# Patient Record
Sex: Female | Born: 1970 | Race: Black or African American | Hispanic: No | Marital: Married | Smoking: Never smoker
Health system: Southern US, Community
[De-identification: ages and names within clinical notes are randomized; demographics above are authoritative.]

## PROBLEM LIST (undated history)

## (undated) DIAGNOSIS — C801 Malignant (primary) neoplasm, unspecified: Secondary | ICD-10-CM

---

## 2017-04-30 ENCOUNTER — Emergency Department (HOSPITAL_COMMUNITY)
Admission: EM | Admit: 2017-04-30 | Discharge: 2017-04-30 | Disposition: A | Discharge status: Home / Self Care | Payer: Self-pay | Attending: Emergency Medicine | Admitting: Emergency Medicine

## 2017-04-30 ENCOUNTER — Encounter (HOSPITAL_COMMUNITY): Payer: Self-pay | Admitting: Emergency Medicine

## 2017-04-30 ENCOUNTER — Emergency Department (HOSPITAL_COMMUNITY): Payer: Self-pay

## 2017-04-30 DIAGNOSIS — R05 Cough: Secondary | ICD-10-CM | POA: Insufficient documentation

## 2017-04-30 DIAGNOSIS — J00 Acute nasopharyngitis [common cold]: Secondary | ICD-10-CM | POA: Insufficient documentation

## 2017-04-30 DIAGNOSIS — Z8521 Personal history of malignant neoplasm of larynx: Secondary | ICD-10-CM | POA: Insufficient documentation

## 2017-04-30 HISTORY — DX: Malignant (primary) neoplasm, unspecified: C80.1

## 2017-04-30 MED ORDER — FLUTICASONE PROPIONATE 50 MCG/ACT NA SUSP: 2.0000 | Freq: Every day | NASAL | 0 refills | Status: AC

## 2017-04-30 MED ORDER — PSEUDOEPHEDRINE HCL ER 120 MG PO TB12: 120.0000 mg | ORAL_TABLET | Freq: Two times a day (BID) | ORAL | 0 refills | Status: AC

## 2017-04-30 NOTE — ED Provider Notes (Signed)
[facility] DEPT Provider Note   CSN: [id number] Arrival date & time: 04/30/17  1308     History   Chief Complaint Chief Complaint  Patient presents with  . Nasal Congestion  . Cough    HPI Mackenzie Oliver is a 46 y.o. female.  HPI Patient presents to the emergency room with complaints of sinus pain, congestion and cough.  Her symptoms started on Saturday.  She tried taking some Mucinex but has not helped.  She denies any fevers.  She has noticed some greenish yellowish nasal discharge and was concerned she was developing a sinus infection.  No trouble with shortness of breath.  No chest pain.  No other complaints Past Medical History:  Diagnosis Date  . 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   Past Surgical History:  Procedure Laterality Date  . CESAREAN SECTION      OB History    No data available       Home Medications    Prior to Admission medications   Medication Sig Start Date End Date Taking? Authorizing Provider  fluticasone (FLONASE) 50 MCG/ACT nasal spray Place 2 sprays into both nostrils daily. 04/30/17   Dorie Rank, MD  pseudoephedrine (SUDAFED 12 HOUR) 120 MG 12 hr tablet Take 1 tablet (120 mg total) by mouth every 12 (twelve) hours. 04/30/17   Dorie Rank, MD    Family History No family history on file.  Social History Social History   Tobacco Use  . Smoking status: Not on file  . Smokeless tobacco: Never Used  Substance Use Topics  . Alcohol use: No    Frequency: Never  . Drug use: No     Allergies   Patient has no known allergies.   Review of Systems Review of Systems  All other systems reviewed and are negative.    Physical Exam Updated Vital Signs BP 104/80 (BP Location: Right Arm)   Pulse 94   Temp 98.1 F (36.7 C) (Oral)   Resp 18   Ht 1.651 m (5\' 5" )   LMP 04/26/2017   SpO2 99%   Physical Exam  Constitutional: She appears  well-developed and well-nourished. No distress.  HENT:  Head: Normocephalic and atraumatic.  Right Ear: External ear normal.  Left Ear: External ear normal.  Mouth/Throat: No oropharyngeal exudate.  No mucopurulent nasal discharge, normal tympanic membranes  Eyes: Conjunctivae are normal. Right eye exhibits no discharge. Left eye exhibits no discharge. No scleral icterus.  Neck: Neck supple. No tracheal deviation present.  Cardiovascular: Normal rate, regular rhythm and intact distal pulses.  Pulmonary/Chest: Effort normal and breath sounds normal. No stridor. No respiratory distress. She has no wheezes. She has no rales.  Abdominal: Soft. Bowel sounds are normal. She exhibits no distension. There is no tenderness. There is no rebound and no guarding.  Musculoskeletal: She exhibits no edema or tenderness.  Neurological: She is alert. She has normal strength. No cranial nerve deficit (no facial droop, extraocular movements intact, no slurred speech) or sensory deficit. She exhibits normal muscle tone. She displays no seizure activity. Coordination normal.  Skin: Skin is warm and dry. No rash noted.  Psychiatric: She has a normal mood and affect.  Nursing note and vitals reviewed.    ED Treatments / Results  Labs (all labs ordered are listed, but only abnormal results are displayed) Labs Reviewed - No data to display  EKG  EKG Interpretation  None       Radiology Dg Chest 2 View  Result Date: 04/30/2017 CLINICAL DATA:  Cough and chest congestion for the past 3 days without fever. No associated symptoms. History of laryngeal malignancy 4 years ago. EXAM: CHEST  2 VIEW COMPARISON:  None in PACs FINDINGS: The lungs are well-expanded and clear. The heart and pulmonary vascularity are normal. The mediastinum is normal in width. There is no pleural effusion. The bony thorax exhibits no acute abnormality. IMPRESSION: There is no active cardiopulmonary disease. Electronically Signed   By:  David  Martinique M.D.   On: 04/30/2017 09:23    Procedures Procedures (including critical care time)  Medications Ordered in ED Medications - No data to display   Initial Impression / Assessment and Plan / ED Course  I have reviewed the triage vital signs and the nursing notes.  Pertinent labs & imaging results that were available during my care of the patient were reviewed by me and considered in my medical decision making (see chart for details).    I suspect the patient has a viral upper respiratory infection with possibly a component of sinusitis.  I will try her on some steroid nasal spray for her sinus congestion.  I recommend Sudafed as well.  Discussed follow-up with her primary care doctor if not improving in the next week.  Final Clinical Impressions(s) / ED Diagnoses   Final diagnoses:  Acute nasopharyngitis    ED Discharge Orders        Ordered    fluticasone (FLONASE) 50 MCG/ACT nasal spray  Daily     04/30/17 0959    pseudoephedrine (SUDAFED 12 HOUR) 120 MG 12 hr tablet  Every 12 hours     04/30/17 0959       Dorie Rank, MD 04/30/17 1008

## 2017-04-30 NOTE — ED Notes (Signed)
Bed: WA05 Expected date:  Expected time:  Means of arrival:  Comments: 

## 2017-04-30 NOTE — ED Triage Notes (Signed)
Patient reports that she been having sinus pain, congestion with productive cough since Saturday. Been taking Mucinex but symptoms still persisting.

## 2017-04-30 NOTE — Discharge Instructions (Signed)
Take the sudafed and Flonase as prescribed.  Follow up with your primary care doctor if not better in a week.

## 2017-07-30 ENCOUNTER — Other Ambulatory Visit (HOSPITAL_COMMUNITY)
Admission: RE | Admit: 2017-07-30 | Discharge: 2017-07-30 | Disposition: A | Discharge status: Home / Self Care | Payer: BLUE CROSS/BLUE SHIELD | Source: Ambulatory Visit | Attending: Nurse Practitioner | Admitting: Nurse Practitioner

## 2017-07-30 ENCOUNTER — Other Ambulatory Visit: Payer: Self-pay | Admitting: Nurse Practitioner

## 2017-07-30 DIAGNOSIS — N926 Irregular menstruation, unspecified: Secondary | ICD-10-CM | POA: Diagnosis not present

## 2017-07-30 DIAGNOSIS — Z01419 Encounter for gynecological examination (general) (routine) without abnormal findings: Secondary | ICD-10-CM | POA: Insufficient documentation

## 2017-07-30 DIAGNOSIS — Z113 Encounter for screening for infections with a predominantly sexual mode of transmission: Secondary | ICD-10-CM | POA: Diagnosis not present

## 2017-08-01 LAB — CYTOLOGY - PAP
DIAGNOSIS: NEGATIVE
HPV: NOT DETECTED

## 2017-08-29 DIAGNOSIS — N898 Other specified noninflammatory disorders of vagina: Secondary | ICD-10-CM | POA: Diagnosis not present

## 2017-08-29 DIAGNOSIS — N926 Irregular menstruation, unspecified: Secondary | ICD-10-CM | POA: Diagnosis not present

## 2017-09-17 ENCOUNTER — Other Ambulatory Visit: Payer: Self-pay | Admitting: Nurse Practitioner

## 2017-09-17 DIAGNOSIS — N926 Irregular menstruation, unspecified: Secondary | ICD-10-CM | POA: Diagnosis not present

## 2017-09-17 DIAGNOSIS — N859 Noninflammatory disorder of uterus, unspecified: Secondary | ICD-10-CM | POA: Diagnosis not present

## 2018-07-29 DIAGNOSIS — F99 Mental disorder, not otherwise specified: Secondary | ICD-10-CM | POA: Diagnosis not present

## 2019-02-24 ENCOUNTER — Other Ambulatory Visit: Payer: Self-pay

## 2019-02-24 ENCOUNTER — Ambulatory Visit: Payer: Self-pay

## 2019-02-24 ENCOUNTER — Other Ambulatory Visit: Payer: Self-pay | Admitting: Family Medicine

## 2019-02-24 DIAGNOSIS — M25511 Pain in right shoulder: Secondary | ICD-10-CM

## 2019-02-24 DIAGNOSIS — M25531 Pain in right wrist: Secondary | ICD-10-CM

## 2019-03-24 DIAGNOSIS — M25531 Pain in right wrist: Secondary | ICD-10-CM | POA: Diagnosis not present

## 2019-03-24 DIAGNOSIS — M25511 Pain in right shoulder: Secondary | ICD-10-CM | POA: Diagnosis not present

## 2019-03-25 DIAGNOSIS — M25531 Pain in right wrist: Secondary | ICD-10-CM | POA: Diagnosis not present

## 2019-03-30 DIAGNOSIS — M25531 Pain in right wrist: Secondary | ICD-10-CM | POA: Diagnosis not present

## 2019-09-15 DIAGNOSIS — Z1151 Encounter for screening for human papillomavirus (HPV): Secondary | ICD-10-CM | POA: Diagnosis not present

## 2019-09-15 DIAGNOSIS — Z1231 Encounter for screening mammogram for malignant neoplasm of breast: Secondary | ICD-10-CM | POA: Diagnosis not present

## 2019-09-15 DIAGNOSIS — Z01419 Encounter for gynecological examination (general) (routine) without abnormal findings: Secondary | ICD-10-CM | POA: Diagnosis not present

## 2019-09-15 DIAGNOSIS — Z130 Encounter for screening for diseases of the blood and blood-forming organs and certain disorders involving the immune mechanism: Secondary | ICD-10-CM | POA: Diagnosis not present

## 2019-09-15 DIAGNOSIS — N898 Other specified noninflammatory disorders of vagina: Secondary | ICD-10-CM | POA: Diagnosis not present

## 2019-09-15 DIAGNOSIS — Z113 Encounter for screening for infections with a predominantly sexual mode of transmission: Secondary | ICD-10-CM | POA: Diagnosis not present

## 2019-09-15 DIAGNOSIS — Z124 Encounter for screening for malignant neoplasm of cervix: Secondary | ICD-10-CM | POA: Diagnosis not present

## 2019-09-15 DIAGNOSIS — Z6829 Body mass index (BMI) 29.0-29.9, adult: Secondary | ICD-10-CM | POA: Diagnosis not present

## 2019-10-02 DIAGNOSIS — F411 Generalized anxiety disorder: Secondary | ICD-10-CM | POA: Diagnosis not present

## 2019-10-06 DIAGNOSIS — F411 Generalized anxiety disorder: Secondary | ICD-10-CM | POA: Diagnosis not present

## 2019-10-06 DIAGNOSIS — R030 Elevated blood-pressure reading, without diagnosis of hypertension: Secondary | ICD-10-CM | POA: Diagnosis not present

## 2019-10-06 DIAGNOSIS — R519 Headache, unspecified: Secondary | ICD-10-CM | POA: Diagnosis not present

## 2019-10-10 ENCOUNTER — Emergency Department (HOSPITAL_COMMUNITY)
Admission: EM | Admit: 2019-10-10 | Discharge: 2019-10-10 | Disposition: A | Discharge status: Home / Self Care | Payer: BC Managed Care – PPO | Attending: Emergency Medicine | Admitting: Emergency Medicine

## 2019-10-10 ENCOUNTER — Other Ambulatory Visit: Payer: Self-pay

## 2019-10-10 ENCOUNTER — Emergency Department (HOSPITAL_COMMUNITY): Payer: BC Managed Care – PPO

## 2019-10-10 DIAGNOSIS — I10 Essential (primary) hypertension: Secondary | ICD-10-CM | POA: Diagnosis not present

## 2019-10-10 DIAGNOSIS — R519 Headache, unspecified: Secondary | ICD-10-CM | POA: Insufficient documentation

## 2019-10-10 DIAGNOSIS — Z79899 Other long term (current) drug therapy: Secondary | ICD-10-CM | POA: Diagnosis not present

## 2019-10-10 LAB — CBC WITH DIFFERENTIAL/PLATELET
Abs Immature Granulocytes: 0.01 10*3/uL (ref 0.00–0.07)
Basophils Absolute: 0 10*3/uL (ref 0.0–0.1)
Basophils Relative: 1 %
Eosinophils Absolute: 0.1 10*3/uL (ref 0.0–0.5)
Eosinophils Relative: 2 %
HCT: 40.4 % (ref 36.0–46.0)
Hemoglobin: 12.9 g/dL (ref 12.0–15.0)
Immature Granulocytes: 0 %
Lymphocytes Relative: 52 %
Lymphs Abs: 2 10*3/uL (ref 0.7–4.0)
MCH: 27.9 pg (ref 26.0–34.0)
MCHC: 31.9 g/dL (ref 30.0–36.0)
MCV: 87.3 fL (ref 80.0–100.0)
Monocytes Absolute: 0.3 10*3/uL (ref 0.1–1.0)
Monocytes Relative: 8 %
Neutro Abs: 1.4 10*3/uL — ABNORMAL LOW (ref 1.7–7.7)
Neutrophils Relative %: 37 %
Platelets: 231 10*3/uL (ref 150–400)
RBC: 4.63 MIL/uL (ref 3.87–5.11)
RDW: 13.1 % (ref 11.5–15.5)
WBC: 3.8 10*3/uL — ABNORMAL LOW (ref 4.0–10.5)
nRBC: 0 % (ref 0.0–0.2)

## 2019-10-10 LAB — I-STAT BETA HCG BLOOD, ED (MC, WL, AP ONLY): I-stat hCG, quantitative: <5 m[IU]/mL (ref <5)

## 2019-10-10 LAB — BASIC METABOLIC PANEL
Anion gap: 8 (ref 5–15)
BUN: 9 mg/dL (ref 6–20)
CO2: 25 mmol/L (ref 22–32)
Calcium: 9.3 mg/dL (ref 8.9–10.3)
Chloride: 105 mmol/L (ref 98–111)
Creatinine, Ser: 0.78 mg/dL (ref 0.44–1.00)
GFR calc Af Amer: >60 mL/min (ref >60)
GFR calc non Af Amer: >60 mL/min (ref >60)
Glucose, Bld: 85 mg/dL (ref 70–99)
Potassium: 4.1 mmol/L (ref 3.5–5.1)
Sodium: 138 mmol/L (ref 135–145)

## 2019-10-10 MED ORDER — METOCLOPRAMIDE HCL 5 MG/ML IJ SOLN
10.0000 mg | Freq: Once | INTRAMUSCULAR | Status: AC
  Administered 2019-10-10: 10 mg via INTRAVENOUS
  Filled 2019-10-10: qty 2

## 2019-10-10 MED ORDER — METOCLOPRAMIDE HCL 10 MG PO TABS
10.0000 mg | ORAL_TABLET | Freq: Four times a day (QID) | ORAL | 0 refills | Status: DC | PRN
Start: 2019-10-10 — End: 2019-10-29

## 2019-10-10 MED ORDER — DIPHENHYDRAMINE HCL 50 MG/ML IJ SOLN
25.0000 mg | Freq: Once | INTRAMUSCULAR | Status: AC
  Administered 2019-10-10: 25 mg via INTRAVENOUS
  Filled 2019-10-10: qty 1

## 2019-10-10 NOTE — Discharge Instructions (Signed)
Take reglan for nausea or headaches   See your doctor. Consider following up with neurology if you have worse headaches   Return to ER if you have vomiting, worse headaches, weakness, numbness

## 2019-10-10 NOTE — ED Notes (Signed)
An After Visit Summary was printed and given to the patient. Discharge instructions given and no further questions at this time.  

## 2019-10-10 NOTE — ED Provider Notes (Signed)
[name]   CSN: [id number] Arrival date & time: 10/10/19  1535     History Chief Complaint  Patient presents with  . Hypertension  . Headache    Mackenzie Oliver is a 49 y.o. female who presented with headache.  She states that she went to a wedding in [place] last week.  She states that she did eat some red meat and she usually is vegan.  She states that she came back on Monday and she started having some headaches.  She states that it is diffuse and associated with some dizziness.  Patient went to the fire station on Tuesday and her blood pressure is 140/90.  She has persistent headache and dizziness.  She denies any trouble speaking or weakness or numbness.  Denies any fevers or chills or neck pain.  The history is provided by the patient.       No past medical history on file.  There are no problems to display for this patient.    OB History   No obstetric history on file.     No family history on file.  Social History   Tobacco Use  . Smoking status: Not on file  Substance Use Topics  . Alcohol use: Not on file  . Drug use: Not on file    Home Medications Prior to Admission medications   Medication Sig Start Date End Date Taking? Authorizing Provider  Apple Cider Vinegar 188 MG CAPS Take 2 capsules by mouth daily.   Yes [provider]  Garlic 10 MG CAPS Take 2 capsules by mouth daily.   Yes [provider]    Allergies    Patient has no known allergies.  Review of Systems   Review of Systems  Neurological: Positive for headaches.  All other systems reviewed and are negative.   Physical Exam Updated Vital Signs BP (!) 150/86 (BP Location: Left Arm)   Pulse 76   Temp 98.2 F (36.8 C) (Oral)   Resp 17   Ht 5\' 4"  (1.626 m)   Wt 79.8 kg   LMP 08/11/2019   SpO2 100%   BMI 30.21 kg/m   Physical Exam Vitals and nursing note reviewed.  Constitutional:      Appearance: She is  well-developed.  HENT:     Head: Normocephalic.     Mouth/Throat:     Mouth: Mucous membranes are moist.  Eyes:     Extraocular Movements: Extraocular movements intact.     Pupils: Pupils are equal, round, and reactive to light.  Cardiovascular:     Rate and Rhythm: Normal rate and regular rhythm.     Heart sounds: Normal heart sounds.  Pulmonary:     Effort: Pulmonary effort is normal.     Breath sounds: Normal breath sounds.  Abdominal:     General: Bowel sounds are normal.     Palpations: Abdomen is soft.  Musculoskeletal:        General: Normal range of motion.     Cervical back: Normal range of motion and neck supple.  Skin:    General: Skin is warm.  Neurological:     Mental Status: She is alert and oriented to person, place, and time. Mental status is at baseline.     Cranial Nerves: No cranial nerve deficit or facial asymmetry.  Psychiatric:        Mood and Affect: Mood normal.        Behavior: Behavior normal.  ED Results / Procedures / Treatments   Labs (all labs ordered are listed, but only abnormal results are displayed) Labs Reviewed  CBC WITH DIFFERENTIAL/PLATELET - Abnormal; Notable for the following components:      Result Value   WBC 3.8 (*)    Neutro Abs 1.4 (*)    All other components within normal limits  BASIC METABOLIC PANEL  I-STAT BETA HCG BLOOD, ED ([facility], [facility], [facility] ONLY)    EKG None  Radiology No results found.  Procedures Procedures (including critical care time)  Medications Ordered in ED Medications  metoCLOPramide (REGLAN) injection 10 mg (10 mg Intravenous Given 10/10/19 1618)  diphenhydrAMINE (BENADRYL) injection 25 mg (25 mg Intravenous Given 10/10/19 1618)    ED Course  I have reviewed the triage vital signs and the nursing notes.  Pertinent labs & imaging results that were available during my care of the patient were reviewed by me and considered in my medical decision making (see chart for details).    MDM  Rules/Calculators/A&P                      [name] is a 49 y.o. female who presented with headaches.  Patient is noted to be hypertensive around 150.  I think likely symptomatic hypertension versus migraines.  Patient has nonfocal neuro exam right now.  Low suspicion for subarachnoid so if CT head is normal, will not need LP.  Given that she had new onset headaches, will get CT head.  We will also get CBC and BMP.  Will give pain cocktail reassess.  6:09 PM BP down to 119/70.  Headache improved.  CT head and labs unremarkable.  Likely migraines, stable for discharge.  Final Clinical Impression(s) / ED Diagnoses Final diagnoses:  None    Rx / DC Orders ED Discharge Orders    None       Drenda Freeze, MD 10/10/19 1810

## 2019-10-10 NOTE — ED Triage Notes (Signed)
Per patient, she was visiting [place] and as she driving back her head began to hurt and feel tight. Patient reports also feeling dizzy. Patient states she had her bp checked and they told her it was elevated. Patient states head does not hurt now but feels tight.

## 2019-10-21 ENCOUNTER — Emergency Department (HOSPITAL_COMMUNITY): Payer: BC Managed Care – PPO

## 2019-10-21 ENCOUNTER — Emergency Department (HOSPITAL_COMMUNITY)
Admission: EM | Admit: 2019-10-21 | Discharge: 2019-10-21 | Disposition: A | Discharge status: Home / Self Care | Payer: BC Managed Care – PPO | Attending: Emergency Medicine | Admitting: Emergency Medicine

## 2019-10-21 ENCOUNTER — Other Ambulatory Visit: Payer: Self-pay

## 2019-10-21 ENCOUNTER — Encounter: Payer: Self-pay | Admitting: Neurology

## 2019-10-21 ENCOUNTER — Encounter (HOSPITAL_COMMUNITY): Payer: Self-pay

## 2019-10-21 DIAGNOSIS — Y9389 Activity, other specified: Secondary | ICD-10-CM | POA: Insufficient documentation

## 2019-10-21 DIAGNOSIS — R42 Dizziness and giddiness: Secondary | ICD-10-CM | POA: Diagnosis not present

## 2019-10-21 DIAGNOSIS — Y92012 Bathroom of single-family (private) house as the place of occurrence of the external cause: Secondary | ICD-10-CM | POA: Insufficient documentation

## 2019-10-21 DIAGNOSIS — S0990XA Unspecified injury of head, initial encounter: Secondary | ICD-10-CM | POA: Insufficient documentation

## 2019-10-21 DIAGNOSIS — Y998 Other external cause status: Secondary | ICD-10-CM | POA: Diagnosis not present

## 2019-10-21 DIAGNOSIS — W19XXXA Unspecified fall, initial encounter: Secondary | ICD-10-CM | POA: Insufficient documentation

## 2019-10-21 DIAGNOSIS — R55 Syncope and collapse: Secondary | ICD-10-CM

## 2019-10-21 DIAGNOSIS — R197 Diarrhea, unspecified: Secondary | ICD-10-CM | POA: Insufficient documentation

## 2019-10-21 DIAGNOSIS — Z859 Personal history of malignant neoplasm, unspecified: Secondary | ICD-10-CM | POA: Insufficient documentation

## 2019-10-21 DIAGNOSIS — F411 Generalized anxiety disorder: Secondary | ICD-10-CM | POA: Diagnosis not present

## 2019-10-21 DIAGNOSIS — R519 Headache, unspecified: Secondary | ICD-10-CM | POA: Diagnosis not present

## 2019-10-21 HISTORY — DX: Malignant (primary) neoplasm, unspecified: C80.1

## 2019-10-21 LAB — CBC
HCT: 39.7 % (ref 36.0–46.0)
Hemoglobin: 12.9 g/dL (ref 12.0–15.0)
MCH: 27.7 pg (ref 26.0–34.0)
MCHC: 32.5 g/dL (ref 30.0–36.0)
MCV: 85.4 fL (ref 80.0–100.0)
Platelets: 249 10*3/uL (ref 150–400)
RBC: 4.65 MIL/uL (ref 3.87–5.11)
RDW: 13.1 % (ref 11.5–15.5)
WBC: 4 10*3/uL (ref 4.0–10.5)
nRBC: 0 % (ref 0.0–0.2)

## 2019-10-21 LAB — BASIC METABOLIC PANEL
Anion gap: 12 (ref 5–15)
BUN: 9 mg/dL (ref 6–20)
CO2: 24 mmol/L (ref 22–32)
Calcium: 9 mg/dL (ref 8.9–10.3)
Chloride: 102 mmol/L (ref 98–111)
Creatinine, Ser: 0.77 mg/dL (ref 0.44–1.00)
GFR calc Af Amer: >60 mL/min (ref >60)
GFR calc non Af Amer: >60 mL/min (ref >60)
Glucose, Bld: 106 mg/dL — ABNORMAL HIGH (ref 70–99)
Potassium: 3.7 mmol/L (ref 3.5–5.1)
Sodium: 138 mmol/L (ref 135–145)

## 2019-10-21 LAB — URINALYSIS, ROUTINE W REFLEX MICROSCOPIC
Bilirubin Urine: NEGATIVE
Glucose, UA: NEGATIVE mg/dL
Ketones, ur: NEGATIVE mg/dL
Nitrite: NEGATIVE
Protein, ur: NEGATIVE mg/dL
Specific Gravity, Urine: 1.008 (ref 1.005–1.030)
pH: 6 (ref 5.0–8.0)

## 2019-10-21 LAB — CBG MONITORING, ED: Glucose-Capillary: 100 mg/dL — ABNORMAL HIGH (ref 70–99)

## 2019-10-21 LAB — I-STAT BETA HCG BLOOD, ED (MC, WL, AP ONLY): I-stat hCG, quantitative: <5 m[IU]/mL (ref <5)

## 2019-10-21 MED ORDER — ONDANSETRON 4 MG PO TBDP
4.0000 mg | ORAL_TABLET | Freq: Three times a day (TID) | ORAL | 0 refills | Status: DC | PRN
Start: 2019-10-21 — End: 2019-10-29

## 2019-10-21 MED ORDER — SODIUM CHLORIDE 0.9% FLUSH: 3.0000 mL | Freq: Once | INTRAVENOUS | Status: DC

## 2019-10-21 NOTE — ED Notes (Signed)
Discharge paperwork and prescription reviewed with pt.  Pt provided printed CT results, per her request.

## 2019-10-21 NOTE — ED Provider Notes (Signed)
[facility] DEPT Provider Note   CSN: [id number] Arrival date & time: 10/21/19  [id number]     History Chief Complaint  Patient presents with   Loss of Consciousness    Mackenzie Oliver is a 49 y.o. female.  HPI Patient still had a syncopal episode last night.  States she went to bed feeling fine but this morning she woke up feeling as if she had to go to the bathroom.  States she went to the bathroom and had some diarrhea.  States she felt very hot and sweaty.  Went to go turn on the air conditioning and then passed out.  Unknown time on the ground.  States she has a headache in the back of her head now.  States he was carpeted floor but there was a box was out of place but does not know if she had hit it.  No chest pain.  No trouble breathing.  Abdomen is feeling now but she states she feels little light in her head.  No fevers.  Cancer free but did have previous cancer history.    Past Medical History:  Diagnosis Date   Cancer ([place])     There are no problems to display for this patient.    OB History   No obstetric history on file.     No family history on file.  Social History   Tobacco Use   Smoking status: Not on file  Substance Use Topics   Alcohol use: Not on file   Drug use: Not on file    Home Medications Prior to Admission medications   Medication Sig Start Date End Date Taking? Authorizing Provider  APPLE CIDER VINEGAR PO Take 10 mLs by mouth daily.   Yes [provider]  Garlic 10 MG CAPS Take 2 capsules by mouth daily.   Yes [provider]  metoCLOPramide (REGLAN) 10 MG tablet Take 1 tablet (10 mg total) by mouth every 6 (six) hours as needed for nausea (nausea/headache). Patient not taking: Reported on 10/21/2019 10/10/19   Drenda Freeze, MD  ondansetron (ZOFRAN-ODT) 4 MG disintegrating tablet Take 1 tablet (4 mg total) by mouth every 8 (eight) hours as needed for nausea or vomiting. 10/21/19   Davonna Belling, MD    Allergies    Patient has no known allergies.  Review of Systems   Review of Systems  Constitutional: Positive for diaphoresis. Negative for appetite change.  HENT: Negative for congestion.   Respiratory: Negative for shortness of breath.   Cardiovascular: Negative for chest pain.  Gastrointestinal: Positive for abdominal distention and diarrhea.  Genitourinary: Negative for flank pain.  Musculoskeletal: Negative for back pain.  Skin: Negative for rash.  Neurological: Positive for syncope and headaches.  Psychiatric/Behavioral: Negative for confusion.    Physical Exam Updated Vital Signs BP 121/82    Pulse 64    Temp 98.4 F (36.9 C) (Oral)    Resp 19    Ht 5\' 5"  (1.651 m)    Wt 79.8 kg    SpO2 100%    BMI 29.29 kg/m   Physical Exam Vitals and nursing note reviewed.  HENT:     Head:     Comments: Some tenderness lower occipital area of skull    Mouth/Throat:     Mouth: Mucous membranes are moist.  Eyes:     Extraocular Movements: Extraocular movements intact.  Cardiovascular:     Rate and Rhythm: Normal rate and regular rhythm.  Pulmonary:  Effort: Pulmonary effort is normal.     Breath sounds: Normal breath sounds.  Abdominal:     Tenderness: There is no abdominal tenderness.  Musculoskeletal:        General: No tenderness.     Cervical back: Neck supple.  Skin:    General: Skin is warm.     Capillary Refill: Capillary refill takes less than 2 seconds.  Neurological:     Mental Status: She is alert and oriented to person, place, and time.  Psychiatric:        Mood and Affect: Mood normal.     ED Results / Procedures / Treatments   Labs (all labs ordered are listed, but only abnormal results are displayed) Labs Reviewed  BASIC METABOLIC PANEL - Abnormal; Notable for the following components:      Result Value   Glucose, Bld 106 (*)    All other components within normal limits  URINALYSIS, ROUTINE W REFLEX MICROSCOPIC - Abnormal; Notable  for the following components:   Hgb urine dipstick SMALL (*)    Leukocytes,Ua SMALL (*)    Bacteria, UA RARE (*)    All other components within normal limits  CBG MONITORING, ED - Abnormal; Notable for the following components:   Glucose-Capillary 100 (*)    All other components within normal limits  CBC  I-STAT BETA HCG BLOOD, ED ([facility], [facility], [facility] ONLY)    EKG EKG Interpretation  Date/Time:  Wednesday October 21 2019 06:53:12 EDT Ventricular Rate:  75 PR Interval:    QRS Duration: 78 QT Interval:  375 QTC Calculation: 419 R Axis:   54 Text Interpretation: Sinus rhythm Anteroseptal infarct, old Borderline T abnormalities, inferior leads 12 Lead; [place] Confirmed by Davonna Belling [phone]) on 10/21/2019 7:59:30 AM   Radiology CT Head Wo Contrast  Result Date: 10/21/2019 CLINICAL DATA:  Syncope episode striking head. Headache and dizziness. EXAM: CT HEAD WITHOUT CONTRAST TECHNIQUE: Contiguous axial images were obtained from the base of the skull through the vertex without intravenous contrast. COMPARISON:  10/10/2019 FINDINGS: Brain: The brainstem, cerebellum, cerebral peduncles, thalami, basal ganglia, basilar cisterns, and ventricular system appear within normal limits. No intracranial hemorrhage, mass lesion, or acute CVA. Vascular: Unremarkable Skull: Unremarkable Sinuses/Orbits: Chronic right frontal sinusitis. Other: No supplemental non-categorized findings. IMPRESSION: 1. No acute intracranial findings. 2. Chronic right frontal sinusitis. Electronically Signed   By: Van Clines M.D.   On: 10/21/2019 09:15    Procedures Procedures (including critical care time)  Medications Ordered in ED Medications - No data to display  ED Course  I have reviewed the triage vital signs and the nursing notes.  Pertinent labs & imaging results that were available during my care of the patient were reviewed by me and considered in my medical decision making (see chart for details).     MDM Rules/Calculators/A&P                      Patient with syncopal episode.  Fell and hit head.  Head CT done reassuring.  Likely concussion.  Feels better.  Abdominal pain improved.  Lab work reassuring except for mild hyperglycemia.  Patient informed of this.  EKG reassuring.  Not orthostatic.  I think most likely had a vagal event due to the GI symptoms and led to the syncope.  Discharge home with outpatient follow-up as needed. Final Clinical Impression(s) / ED Diagnoses Final diagnoses:  Vasovagal syncope  Minor head injury, initial encounter  Diarrhea, unspecified type  Rx / DC Orders ED Discharge Orders         Ordered    ondansetron (ZOFRAN-ODT) 4 MG disintegrating tablet  Every 8 hours PRN     10/21/19 0933           Davonna Belling, MD 10/21/19 [phone]

## 2019-10-21 NOTE — ED Triage Notes (Signed)
Patient arrived stating this morning she woke up from sleeping around 430am with severe generalized abdominal pain and feeling hot. States she got up to turn the air on and passed out onto carpet flooring for an unknown amount of time. Patient reports abdominal pain has resolved but still feels lightheaded. Declines any changes in vision or headache at this time.

## 2019-10-26 DIAGNOSIS — F411 Generalized anxiety disorder: Secondary | ICD-10-CM | POA: Diagnosis not present

## 2019-10-29 ENCOUNTER — Encounter: Payer: Self-pay | Admitting: Neurology

## 2019-10-29 ENCOUNTER — Ambulatory Visit (INDEPENDENT_AMBULATORY_CARE_PROVIDER_SITE_OTHER): Payer: BC Managed Care – PPO | Admitting: Neurology

## 2019-10-29 VITALS — BP 117/72 | HR 68 | Ht 64.0 in | Wt 177.0 lb

## 2019-10-29 DIAGNOSIS — R55 Syncope and collapse: Secondary | ICD-10-CM | POA: Diagnosis not present

## 2019-10-29 NOTE — Progress Notes (Signed)
ED follow-up   Provider:  Larey Seat, MD  Primary Care Physician:  Mackenzie Oliver, No Pcp Per No address on file     Referring Provider: ED  Dr. Ivan Anchors         Chief Complaint according to Mackenzie Oliver   Mackenzie Oliver presents with:    . New Mackenzie Oliver (Initial Visit)           HISTORY OF PRESENT ILLNESS:  Mackenzie Oliver is a 49 year old 28 or [place] American female Mackenzie Oliver seen here upon a referral on 10/29/2019 from ED.  Chief concern according to Mackenzie Oliver : " I fainted"   I have the pleasure of seeing Mackenzie Oliver today, a right handed African American female with a syncope.  [name] states that her husband had left for work the morning of 21 October 2019 when she felt really very hot had a bad feeling and went to the bathroom.  After she had a bowel movement she felt still very hot and diaphoretic and went to the thermostat to drink to temperature. There was a little lightheadedness that she has reported but no vertigo.  No true fever, no tunnel vision no other aura.  However she had felt somewhat ill and sickly and having stomach pains - she had diarrhea. She felt all this  for several minutes before she passed out.  She passed out in the hallway of her home and is not sure how long she may have been on the floor.  She came back by herself was able to call her husband and asked him to drive her to the emergency room.  She woke up on a carpeted floor with a neck strain and reported that after she was evaluated in the ED when returning home she felt that both legs were numbish.  However the feeling passed. She had a scratch on the right wrist and a container on the floor was moved. She is sore at neck and wrist.   She was seen on 10-10-2019 also in ED with elevated BP - and dizziness, lightheadedness. She reported vertigo before - and she felt a very tight band around around the skalp.   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    Mackenzie Oliver has no  history of heart disease, seizures or neurologic disorders. She had vertigo in 2014.    ER labs did not include a stool sample. CT negative , no head injury. No acute intracranial findings.  Chronic right frontal sinusitis. EKG was automatically interpreted - T abnormality.       Review of Systems: Out of a complete 14 system review, the Mackenzie Oliver complains of only the following symptoms, and all other reviewed systems are negative.:   Stomach pains - for a day or two. Epigastric and BM were diarrhea.     Social History   Socioeconomic History  . Marital status: Married    Spouse name: Not on file  . Number of children: Not on file  . Years of education: Not on file  . Highest education level: Not on file  Occupational History  . Not on file  Tobacco Use  . Smoking status: Never Smoker  . Smokeless tobacco: Never Used  Substance and Sexual Activity  . Alcohol use: Never  . Drug use: Never  . Sexual activity: Not on file  Other Topics Concern  . Not on file  Social History Narrative  . Not on file  Social Determinants of Health   Financial Resource Strain:   . Difficulty of Paying Living Expenses:   Food Insecurity:   . Worried About Charity fundraiser in the Last Year:   . Arboriculturist in the Last Year:   Transportation Needs:   . Film/video editor (Medical):   Marland Kitchen Lack of Transportation (Non-Medical):   Physical Activity:   . Days of Exercise per Week:   . Minutes of Exercise per Session:   Stress:   . Feeling of Stress :   Social Connections:   . Frequency of Communication with Friends and Family:   . Frequency of Social Gatherings with Friends and Family:   . Attends Religious Services:   . Active Member of Clubs or Organizations:   . Attends Archivist Meetings:   Marland Kitchen Marital Status:     Family History  Problem Relation Age of Onset  . High blood pressure Mother   . Cancer Father     Past Medical History:  Diagnosis Date  . Cancer  [facility])      Current Outpatient Medications on File Prior to Visit  Medication Sig Dispense Refill  . APPLE CIDER VINEGAR PO Take 10 mLs by mouth daily.    . Garlic 10 MG CAPS Take 2 capsules by mouth daily.     No current facility-administered medications on file prior to visit.    Physical exam:  The Mackenzie Oliver's orthostatic blood pressure and heart rate were obtained in supine position blood pressure was 120/72 mmHg with a regular heart rate of 65 bpm. Seated position 117/75 mmHg and a regular heart rate of 68 bpm. Standing 119/80 mmHg and a heart rate of 72.   Today's Vitals   10/29/19 0827  BP: 117/72  Pulse: 68  Weight: 177 lb (80.3 kg)  Height: 5\' 4"  (1.626 m)   Body mass index is 30.38 kg/m.   Wt Readings from Last 3 Encounters:  10/29/19 177 lb (80.3 kg)  10/21/19 176 lb (79.8 kg)  10/10/19 176 lb (79.8 kg)     Ht Readings from Last 3 Encounters:  10/29/19 5\' 4"  (1.626 m)  10/21/19 5\' 5"  (1.651 m)  10/10/19 5\' 4"  (1.626 m)      General: The Mackenzie Oliver is awake, alert and appears not in acute distress.  The Mackenzie Oliver is well groomed. Head: Normocephalic, atraumatic. Neck is supple. Mallampati:2 ,  neck circumference: 15 inches . Nasal airflow  patent.   Cardiovascular:  Regular rate and cardiac rhythm by pulse,  without distended neck veins. Respiratory: Lungs are clear to auscultation.  Skin:  Without evidence of ankle edema, or rash. Trunk: The Mackenzie Oliver's posture is erect.   Neurologic exam : The Mackenzie Oliver is awake and alert, oriented to place and time.   Memory subjective described as intact.  Attention span & concentration ability appears normal.  Speech is fluent,  without  dysarthria, dysphonia or aphasia.  Mood and affect are appropriate.   Cranial nerves: no loss of smell or taste reported  Pupils are equal and briskly reactive to light. Funduscopic exam  without pallor or retinal scars, edema. Extraocular movements in vertical and horizontal planes were  intact and without nystagmus. No Diplopia. Visual fields by finger perimetry are intact. Hearing was intact to soft voice and finger rubbing.    Facial sensation intact to fine touch.  Palpation of the sinus trigger points did not elicit pain or discomfort. Facial motor strength is symmetric and tongue and uvula  move midline.  Borderline ptosis both eyes. Neck ROM : rotation, tilt and flexion extension were normal for age and shoulder shrug was symmetrical.    Motor exam:  Symmetric bulk, tone and ROM.   Normal tone without cog wheeling, symmetric grip strength .   Sensory:  Fine touch and vibration were tested  and  normal.  Proprioception tested in the upper extremities was normal.   Coordination: Rapid alternating movements in the fingers/hands were of normal speed.  The Finger-to-nose maneuver was intact without evidence of ataxia, dysmetria or tremor. The Mackenzie Oliver does not experience changes in handwriting or fine motor skills otherwise.   Gait and station: Mackenzie Oliver could rise unassisted from a seated position, walked without assistive device.  Stance is of normal width/ base and the Mackenzie Oliver turned with 3 steps.  Toe and heel walk were deferred.  Deep tendon reflexes: in the  upper and lower extremities are symmetrically brisk and intact.  Babinski response was deferred.       After spending a total time of 45  minutes face to face and additional time for physical and neurologic examination, review of laboratory studies,  personal review of imaging studies, reports and results of other testing and review of referral information / records as far as provided in visit, I have established the following assessments:  1) Ms. Mason Burleigh is a 49 year old right-handed female Mackenzie Oliver who moved from [place] to [place] and has not yet established a primary care physician. 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 In late May she experienced high blood pressures, now and early June she had a bout of abdominal pain and discomfort followed by diarrhea, a spell of diaphoresis and passing out apparently while in the process of changing with some stop settings.  She may have bumped her head given that she had some headaches afterwards and a sore spot but CTs have not shown any intracranial abnormality or injury. Her diagnosis was a vasovagal syncope, and in spite of no evidence of incontinence or bruising or tongue bite she was referred to neurology.    My Plan is to proceed with:  1) The Mackenzie Oliver needs a primary care provider, these are not primary neurologic problems and need to be coordinated in work up by other than the ED>  2) this is a classic syncope in a Mackenzie Oliver with GI symptoms at the time.  3) No further neuro work up.    PCP at Home:  Terrace Arabia, [place], [place] Phone [phone] area.  [phone].   Electronically signed by: Larey Seat, MD 10/29/2019 8:42 AM  [facility] Board certified by The [employer] of Sleep Medicine and Diplomate of the [employer] of Sleep Medicine. Board certified In Neurology through the [place], Fellow of the [employer] of Neurology. Medical Director of [employer].

## 2019-10-29 NOTE — Patient Instructions (Signed)
     Syncope Syncope is when you pass out (faint) for a short time. It is caused by a sudden decrease in blood flow to the brain. Signs that you may be about to pass out include:  Feeling dizzy or light-headed.  Feeling sick to your stomach (nauseous).  Seeing all white or all black.  Having cold, clammy skin. If you pass out, get help right away. Call your local emergency services (911 in the [place] Do not drive yourself to the hospital. Follow these instructions at home: Watch for any changes in your symptoms. Take these actions to stay safe and help with your symptoms: Lifestyle  Do not drive, use machinery, or play sports until your doctor says it is okay.  Do not drink alcohol.  Do not use any products that contain nicotine or tobacco, such as cigarettes and e-cigarettes. If you need help quitting, ask your doctor.  Drink enough fluid to keep your pee (urine) pale yellow. General instructions  Take over-the-counter and prescription medicines only as told by your doctor.  If you are taking blood pressure or heart medicine, sit up and stand up slowly. Spend a few minutes getting ready to sit and then stand. This can help you feel less dizzy.  Have someone stay with you until you feel stable.  If you start to feel like you might pass out, lie down right away and raise (elevate) your feet above the level of your heart. Breathe deeply and steadily. Wait until all of the symptoms are gone.  Keep all follow-up visits as told by your doctor. This is important. Get help right away if:  You have a very bad headache.  You pass out once or more than once.  You have pain in your chest, belly, or back.  You have a very fast or uneven heartbeat (palpitations).  It hurts to breathe.  You are bleeding from your mouth or your bottom (rectum).  You have black or tarry poop (stool).  You have jerky movements that you cannot control (seizure).  You are confused.  You have  trouble walking.  You are very weak.  You have vision problems. These symptoms may be an emergency. Do not wait to see if the symptoms will go away. Get medical help right away. Call your local emergency services (911 in the [place] Do not drive yourself to the hospital. Summary  Syncope is when you pass out (faint) for a short time. It is caused by a sudden decrease in blood flow to the brain.  Signs that you may be about to faint include feeling dizzy, light-headed, or sick to your stomach, seeing all white or all black, or having cold, clammy skin.  If you start to feel like you might pass out, lie down right away and raise (elevate) your feet above the level of your heart. Breathe deeply and steadily. Wait until all of the symptoms are gone. This information is not intended to replace advice given to you by your health care provider. Make sure you discuss any questions you have with your health care provider. Document Revised: 06/19/2017 Document Reviewed: 06/19/2017 Elsevier Patient Education  2020 Elsevier Inc.  

## 2019-11-04 DIAGNOSIS — F411 Generalized anxiety disorder: Secondary | ICD-10-CM | POA: Diagnosis not present

## 2019-11-09 DIAGNOSIS — F411 Generalized anxiety disorder: Secondary | ICD-10-CM | POA: Diagnosis not present

## 2019-11-16 DIAGNOSIS — F411 Generalized anxiety disorder: Secondary | ICD-10-CM | POA: Diagnosis not present

## 2019-11-23 DIAGNOSIS — F411 Generalized anxiety disorder: Secondary | ICD-10-CM | POA: Diagnosis not present

## 2019-11-30 DIAGNOSIS — F411 Generalized anxiety disorder: Secondary | ICD-10-CM | POA: Diagnosis not present

## 2019-12-07 DIAGNOSIS — F411 Generalized anxiety disorder: Secondary | ICD-10-CM | POA: Diagnosis not present

## 2019-12-15 DIAGNOSIS — F411 Generalized anxiety disorder: Secondary | ICD-10-CM | POA: Diagnosis not present

## 2019-12-23 DIAGNOSIS — F411 Generalized anxiety disorder: Secondary | ICD-10-CM | POA: Diagnosis not present

## 2020-01-04 DIAGNOSIS — F411 Generalized anxiety disorder: Secondary | ICD-10-CM | POA: Diagnosis not present

## 2020-01-11 ENCOUNTER — Ambulatory Visit: Payer: BC Managed Care – PPO | Admitting: Neurology

## 2020-02-02 DIAGNOSIS — F411 Generalized anxiety disorder: Secondary | ICD-10-CM | POA: Diagnosis not present

## 2020-02-05 DIAGNOSIS — N7689 Other specified inflammation of vagina and vulva: Secondary | ICD-10-CM | POA: Diagnosis not present

## 2020-02-05 DIAGNOSIS — Z7251 High risk heterosexual behavior: Secondary | ICD-10-CM | POA: Diagnosis not present

## 2020-02-09 DIAGNOSIS — F411 Generalized anxiety disorder: Secondary | ICD-10-CM | POA: Diagnosis not present

## 2020-02-15 DIAGNOSIS — F411 Generalized anxiety disorder: Secondary | ICD-10-CM | POA: Diagnosis not present

## 2020-02-22 DIAGNOSIS — F411 Generalized anxiety disorder: Secondary | ICD-10-CM | POA: Diagnosis not present

## 2020-03-11 DIAGNOSIS — Z113 Encounter for screening for infections with a predominantly sexual mode of transmission: Secondary | ICD-10-CM | POA: Diagnosis not present

## 2020-03-11 DIAGNOSIS — R102 Pelvic and perineal pain: Secondary | ICD-10-CM | POA: Diagnosis not present

## 2020-03-11 DIAGNOSIS — N952 Postmenopausal atrophic vaginitis: Secondary | ICD-10-CM | POA: Diagnosis not present

## 2020-03-11 DIAGNOSIS — Z3202 Encounter for pregnancy test, result negative: Secondary | ICD-10-CM | POA: Diagnosis not present

## 2020-03-22 DIAGNOSIS — N898 Other specified noninflammatory disorders of vagina: Secondary | ICD-10-CM | POA: Diagnosis not present

## 2020-03-22 DIAGNOSIS — R102 Pelvic and perineal pain: Secondary | ICD-10-CM | POA: Diagnosis not present

## 2020-03-22 DIAGNOSIS — N926 Irregular menstruation, unspecified: Secondary | ICD-10-CM | POA: Diagnosis not present

## 2020-04-05 DIAGNOSIS — F411 Generalized anxiety disorder: Secondary | ICD-10-CM | POA: Diagnosis not present

## 2020-04-19 DIAGNOSIS — F411 Generalized anxiety disorder: Secondary | ICD-10-CM | POA: Diagnosis not present

## 2020-04-27 DIAGNOSIS — F411 Generalized anxiety disorder: Secondary | ICD-10-CM | POA: Diagnosis not present

## 2020-12-05 ENCOUNTER — Emergency Department (HOSPITAL_COMMUNITY)
Admission: EM | Admit: 2020-12-05 | Discharge: 2020-12-05 | Disposition: A | Discharge status: Home / Self Care | Payer: 59 | Attending: Emergency Medicine | Admitting: Emergency Medicine

## 2020-12-05 ENCOUNTER — Emergency Department (HOSPITAL_COMMUNITY): Payer: 59

## 2020-12-05 ENCOUNTER — Encounter (HOSPITAL_COMMUNITY): Payer: Self-pay

## 2020-12-05 ENCOUNTER — Other Ambulatory Visit: Payer: Self-pay

## 2020-12-05 DIAGNOSIS — N9489 Other specified conditions associated with female genital organs and menstrual cycle: Secondary | ICD-10-CM | POA: Diagnosis not present

## 2020-12-05 DIAGNOSIS — R0789 Other chest pain: Secondary | ICD-10-CM | POA: Diagnosis not present

## 2020-12-05 DIAGNOSIS — R079 Chest pain, unspecified: Secondary | ICD-10-CM

## 2020-12-05 DIAGNOSIS — M549 Dorsalgia, unspecified: Secondary | ICD-10-CM | POA: Diagnosis not present

## 2020-12-05 DIAGNOSIS — Z8521 Personal history of malignant neoplasm of larynx: Secondary | ICD-10-CM | POA: Diagnosis not present

## 2020-12-05 LAB — CBC
HCT: 38.2 % (ref 36.0–46.0)
Hemoglobin: 12.7 g/dL (ref 12.0–15.0)
MCH: 28.3 pg (ref 26.0–34.0)
MCHC: 33.2 g/dL (ref 30.0–36.0)
MCV: 85.1 fL (ref 80.0–100.0)
Platelets: 245 10*3/uL (ref 150–400)
RBC: 4.49 MIL/uL (ref 3.87–5.11)
RDW: 13.3 % (ref 11.5–15.5)
WBC: 3.4 10*3/uL — ABNORMAL LOW (ref 4.0–10.5)
nRBC: 0 % (ref 0.0–0.2)

## 2020-12-05 LAB — BASIC METABOLIC PANEL
Anion gap: 7 (ref 5–15)
BUN: 8 mg/dL (ref 6–20)
CO2: 25 mmol/L (ref 22–32)
Calcium: 9.8 mg/dL (ref 8.9–10.3)
Chloride: 107 mmol/L (ref 98–111)
Creatinine, Ser: 0.63 mg/dL (ref 0.44–1.00)
GFR, Estimated: >60 mL/min (ref >60)
Glucose, Bld: 123 mg/dL — ABNORMAL HIGH (ref 70–99)
Potassium: 3.7 mmol/L (ref 3.5–5.1)
Sodium: 139 mmol/L (ref 135–145)

## 2020-12-05 LAB — I-STAT BETA HCG BLOOD, ED (MC, WL, AP ONLY): I-stat hCG, quantitative: <5 m[IU]/mL (ref <5)

## 2020-12-05 LAB — TROPONIN I (HIGH SENSITIVITY)
Troponin I (High Sensitivity): <2 ng/L (ref <18)
Troponin I (High Sensitivity): <2 ng/L (ref <18)

## 2020-12-05 MED ORDER — HYDROXYZINE HCL 25 MG PO TABS: 25.0000 mg | ORAL_TABLET | Freq: Four times a day (QID) | ORAL | 0 refills | Status: AC | PRN

## 2020-12-05 NOTE — ED Triage Notes (Signed)
Patient reports intermittent chest pressure that radiates into the mid x 1 week. Patient denies any SOB, N/V.

## 2020-12-05 NOTE — ED Provider Notes (Signed)
[facility] DEPT Provider Note   CSN: [id number] Arrival date & time: 12/05/20  1607     History Chief Complaint  Patient presents with   Chest Pain    Mackenzie Oliver is a 50 y.o. female.  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  Patient is presenting to the ER today with complaints of episodic chest pain since Friday that has resolved now.  She states that this seems to come and go.  She has had 2 episodes while in the emergency room for the past 5 hours.  Denies any lightheadedness or dizziness.  Denies any shortness of breath nausea vomiting.  She states the pain is achy and central nonradiating nonexertional nonpleuritic.  She states that she has had some separate back pain but does not correlate with when her chest pain comes on.  She denies any numbness or weakness in the arm she states that she does occasionally have some left arm numbness but this is a ongoing issue also does not seem to temporally correlate to her chest pain.  She denies any fevers chills nausea vomiting diarrhea lightheadedness or dizziness.  She states that she takes no medications chronically.   She denies any new exercise or lifting.  She does state that she is under symptoms about of stress currently because of home life reasons.  She states she feels safe at home but is not interested in disclosing more than this.    Past Medical History:  Diagnosis Date   Cancer [facility])     Patient Active Problem List   Diagnosis Date Noted   Syncope, vasovagal 10/29/2019    Past Surgical History:  Procedure Laterality Date   CESAREAN SECTION       OB History   No obstetric history on file.     Family History  Problem Relation Age of Onset   High blood pressure Mother    Cancer Father     Social History   Tobacco Use   Smoking status: Never   Smokeless tobacco:  Never  Vaping Use   Vaping Use: Never used  Substance Use Topics   Alcohol use: Never   Drug use: Never    Home Medications Prior to Admission medications   Medication Sig Start Date End Date Taking? Authorizing Provider  hydrOXYzine (ATARAX/VISTARIL) 25 MG tablet Take 1 tablet (25 mg total) by mouth every 6 (six) hours as needed. 12/05/20  Yes Jakylah Bassinger S, PA  APPLE CIDER VINEGAR PO Take 10 mLs by mouth daily.    [provider]  Garlic 10 MG CAPS Take 2 capsules by mouth daily.    [provider]    Allergies    Patient has no known allergies.  Review of Systems   Review of Systems  Constitutional:  Negative for chills and fever.  HENT:  Negative for congestion.   Eyes:  Negative for pain.  Respiratory:  Negative for shortness of breath.   Cardiovascular:  Positive for chest pain. Negative for leg swelling.  Gastrointestinal:  Negative for abdominal pain, diarrhea, nausea and vomiting.  Genitourinary:  Negative for dysuria.  Musculoskeletal:  Negative for myalgias.  Skin:  Negative for rash.  Neurological:  Negative for dizziness and headaches.   Physical Exam Updated Vital Signs BP 130/81   Pulse 65   Temp 98.2 F (36.8 C) (Oral)   Resp 17   Ht  5\' 5"  (1.651 m)   Wt 77.1 kg   LMP  (Exact Date)   SpO2 100%   BMI 28.29 kg/m   Physical Exam Vitals and nursing note reviewed.  Constitutional:      General: She is not in acute distress.    Comments: Pleasant well-appearing 50 year old.  In no acute distress.  Sitting comfortably in bed.  Able answer questions appropriately follow commands. No increased work of breathing. Speaking in full sentences.  HENT:     Head: Normocephalic and atraumatic.     Nose: Nose normal.  Eyes:     General: No scleral icterus. Cardiovascular:     Rate and Rhythm: Normal rate and regular rhythm.     Pulses: Normal pulses.     Heart sounds: Normal heart sounds.  Pulmonary:     Effort: Pulmonary effort is  normal. No respiratory distress.     Breath sounds: No wheezing.  Abdominal:     Palpations: Abdomen is soft.     Tenderness: There is no abdominal tenderness. There is no guarding or rebound.  Musculoskeletal:     Cervical back: Normal range of motion.     Right lower leg: No edema.     Left lower leg: No edema.     Comments: No lower extremity edema.  No calf tenderness.  Skin:    General: Skin is warm and dry.     Capillary Refill: Capillary refill takes less than 2 seconds.  Neurological:     Mental Status: She is alert. Mental status is at baseline.  Psychiatric:        Mood and Affect: Mood normal.        Behavior: Behavior normal.    ED Results / Procedures / Treatments   Labs (all labs ordered are listed, but only abnormal results are displayed) Labs Reviewed  BASIC METABOLIC PANEL - Abnormal; Notable for the following components:      Result Value   Glucose, Bld 123 (*)    All other components within normal limits  CBC - Abnormal; Notable for the following components:   WBC 3.4 (*)    All other components within normal limits  I-STAT BETA HCG BLOOD, ED ([facility], [facility], [facility] ONLY)  TROPONIN I (HIGH SENSITIVITY)  TROPONIN I (HIGH SENSITIVITY)    EKG None  Radiology DG Chest 2 View  Result Date: 12/05/2020 CLINICAL DATA:  Chest pain. EXAM: CHEST - 2 VIEW COMPARISON:  None. FINDINGS: The cardiomediastinal contours are normal. The lungs are clear. Pulmonary vasculature is normal. No consolidation, pleural effusion, or pneumothorax. No acute osseous abnormalities are seen. IMPRESSION: Negative radiographs of the chest. Electronically Signed   By: Keith Rake M.D.   On: 12/05/2020 20:48    Procedures Procedures   Medications Ordered in ED Medications - No data to display  ED Course  I have reviewed the triage vital signs and the nursing notes.  Pertinent labs & imaging results that were available during my care of the patient were reviewed by me and considered in my  medical decision making (see chart for details).  Clinical Course as of 12/05/20 2302  Mon Dec 05, 2020  2019 The emergent causes of chest pain include: Acute coronary syndrome, tamponade, pericarditis/myocarditis, aortic dissection, pulmonary embolism, tension pneumothorax, pneumonia, and esophageal rupture  I do not believe the patient has an emergent cause of chest pain, other urgent/non-acute considerations include, but are not limited to: chronic angina, aortic stenosis, cardiomyopathy, mitral valve prolapse, pulmonary hypertension, aortic  insufficiency, right ventricular hypertrophy, pleuritis, bronchitis, pneumothorax, tumor, gastroesophageal reflux disease (GERD), esophageal spasm, Mallory-Weiss syndrome, peptic ulcer disease, pancreatitis, functional gastrointestinal pain, cervical or thoracic disk disease or arthritis, shoulder arthritis, costochondritis, subacromial bursitis, anxiety or panic attack, herpes zoster, breast disorders, chest wall tumors, thoracic outlet syndrome, mediastinitis.    [WF]    Clinical Course User Index [WF] Tedd Sias, [place]   MDM Rules/Calculators/A&P                          Patient has intermittent chest pain no chest pain currently.  Vital signs within normal limits physical exam quite reassuring.  Low risk for pulmonary embolism has a history of cancer however it is remote was vocal cord tumor which is removed.  No other risk factors for PE.  She is well-appearing not tachycardic tachypneic or hypoxic.  Low suspicion for ACS given both the intermittent and unprovoked nature but also the atypical nature of it.  Overall she is quite reassuring exam.  Chest x-ray without abnormality.  EKG nonischemic.  No ST-T wave abnormalities of note.  Normal axis.  Some T wave inversions in V3, V4, V5.  Otherwise unremarkable EKG  Troponin x2 within normal limits.  BMP unremarkable.  CBC with mild leukopenia.  I-STAT hCG negative.  She is given very strict  return precautions.  She feels that her anxiety may be causing some of her symptoms therefore we will treat with hydroxyzine and discharged home with strict return precautions.  She will follow-up with PCP.  Final Clinical Impression(s) / ED Diagnoses Final diagnoses:  Chest pain, unspecified type  Atypical chest pain    Rx / DC Orders ED Discharge Orders          Ordered    hydrOXYzine (ATARAX/VISTARIL) 25 MG tablet  Every 6 hours PRN        12/05/20 2055             Tedd Sias, [place] 12/05/20 2304    Tegeler, Gwenyth Allegra, MD 12/05/20 [phone]

## 2020-12-05 NOTE — Discharge Instructions (Addendum)
Please follow-up with your primary care provider.  Please drink plenty of water you may take Tylenol 1000 mg every 6 hours for pain.  You may also apply a small amount of Voltaren gel to your chest--these treatments may improve your symptoms if this is a muscular injury however that seems relatively less likely.  I am also prescribing medication called hydroxyzine/Vistaril.  Please take this as prescribed.  Can help with anxiety related symptoms.  Ultimately however it is a second line medication and you should talk to your primary care provider about other medications for anxiety.   Please return to the ER for any new or concerning symptoms.

## 2020-12-05 NOTE — ED Provider Notes (Signed)
Emergency Medicine Provider Triage Evaluation Note  Mackenzie Oliver , a 50 y.o. female  was evaluated in triage.  Pt complains of episodes of central chest pain on Friday that self resolved now presenting to the emergency department today with concern for central chest pressure that radiates to her back.  No associated shortness of breath or palpitations.  No history of the same.  Denies any history of heartburn patient is a survivor of vocal cord cancer and is requesting to avoid any unnecessary radiation, including chest x-rays.  Review of Systems  Positive: Chest pain/pressure that radiates to the back Negative: Shortness of breath, palpitations, nausea, vomiting, diarrhea, fevers, chills  Physical Exam  BP 120/79   Pulse 77   Temp 98.2 F (36.8 C) (Oral)   Resp 15   Ht 5\' 5"  (1.651 m)   Wt 77.1 kg   LMP  (Exact Date)   SpO2 100%   BMI 28.29 kg/m  Gen:   Awake, no distress   Resp:  Normal effort  MSK:   Moves extremities without difficulty  Other:  RRR no M/R/G.  No epigastric tenderness to palpation.  Medical Decision Making  Medically screening exam initiated at 5:48 PM.  Appropriate orders placed.  Jalee Saine was informed that the remainder of the evaluation will be completed by another provider, this initial triage assessment does not replace that evaluation, and the importance of remaining in the ED until their evaluation is complete.  This chart was dictated using voice recognition software, Dragon. Despite the best efforts of this provider to proofread and correct errors, errors may still occur which can change documentation meaning.   Patient has declined chest x-ray at this time.   Aura Dials 12/05/20 1749    Quintella Reichert, MD 12/05/20 [phone]

## 2021-10-05 IMAGING — CT CT HEAD W/O CM
3 series · 16 of 47 positions shown, 19 images · non-contrast
Comparison: None.

CLINICAL DATA: Headache, dizziness, hypertension

EXAM:
CT HEAD WITHOUT CONTRAST
TECHNIQUE: Contiguous axial images were obtained from the base of the skull
through the vertex without intravenous contrast.

[Series 2: head wo · axial · 0.47mm/px · z∈[+1576,+1701]mm · 10 of 31 slices shown, 13 images]
[im 3/31  brain]
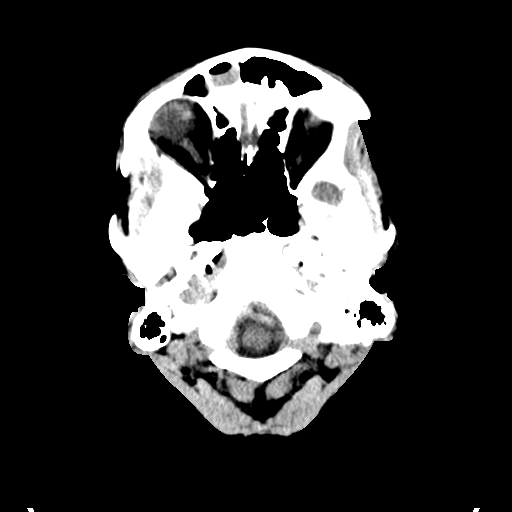
[im 3/31  bone]
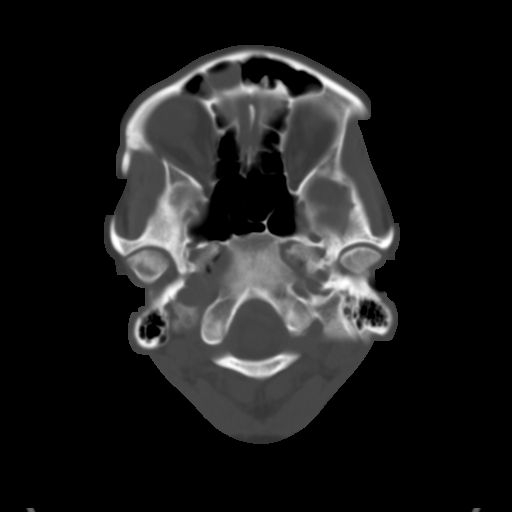
[im 6/31  brain]
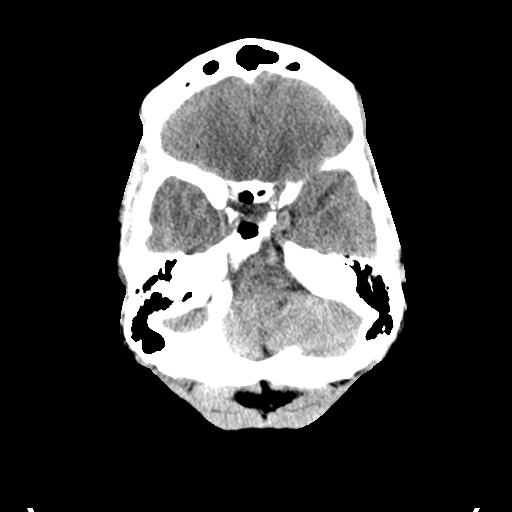
[im 9/31  brain]
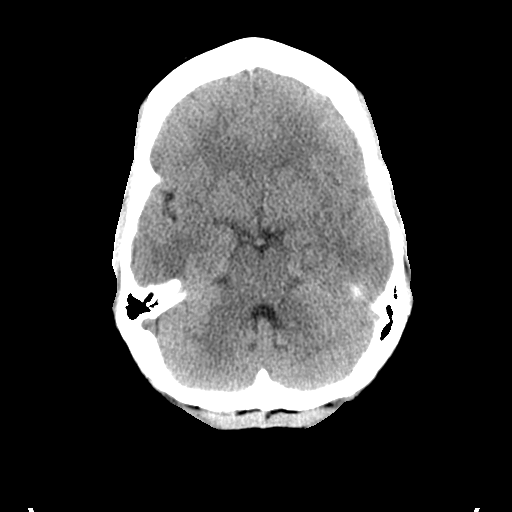
[im 11/31  brain]
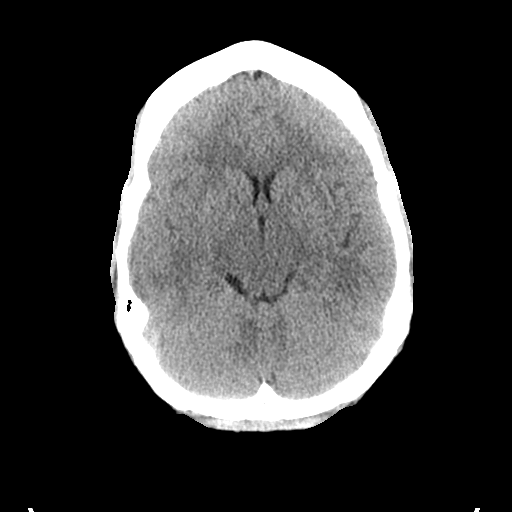
[im 14/31  brain]
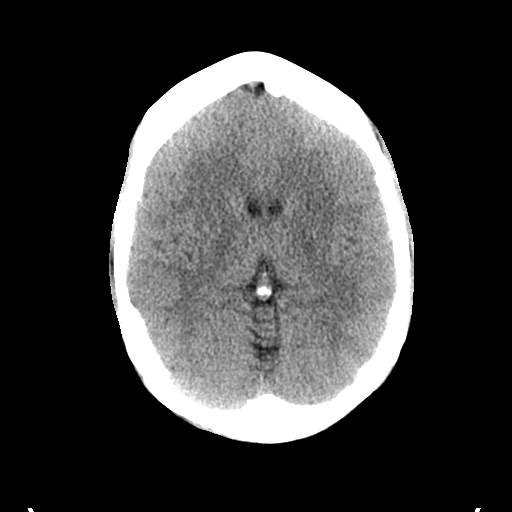
[im 14/31  bone]
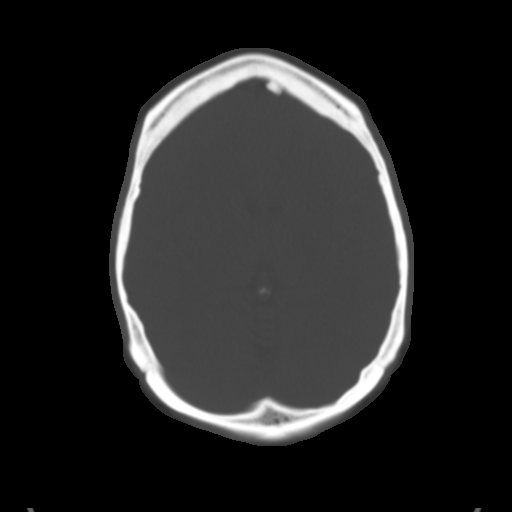
[im 17/31  brain]
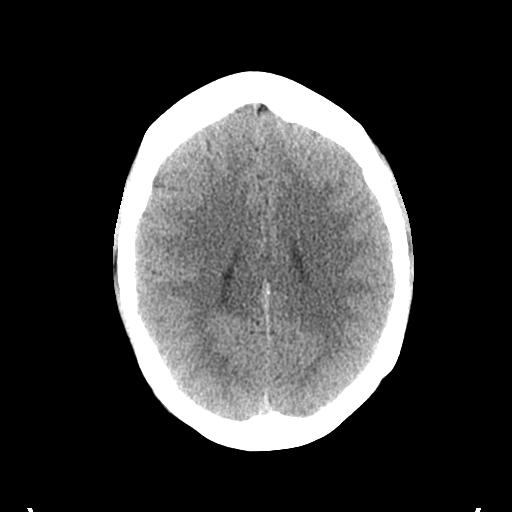
[im 20/31  brain]
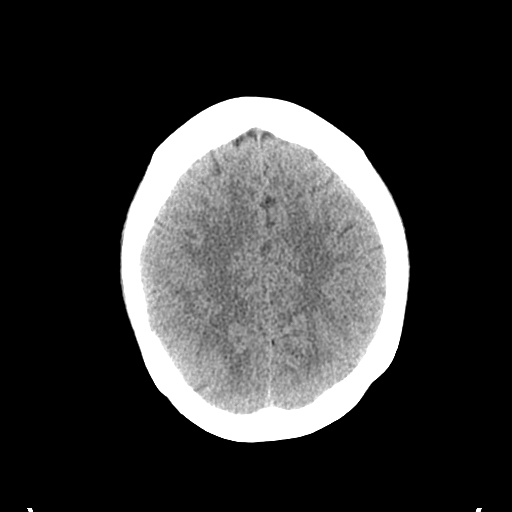
[im 23/31  brain]
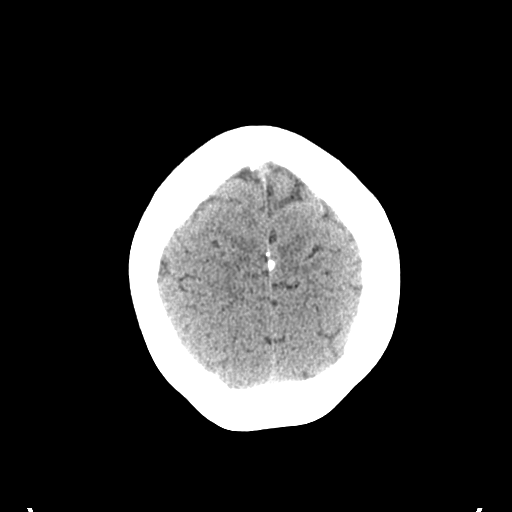
[im 25/31  brain]
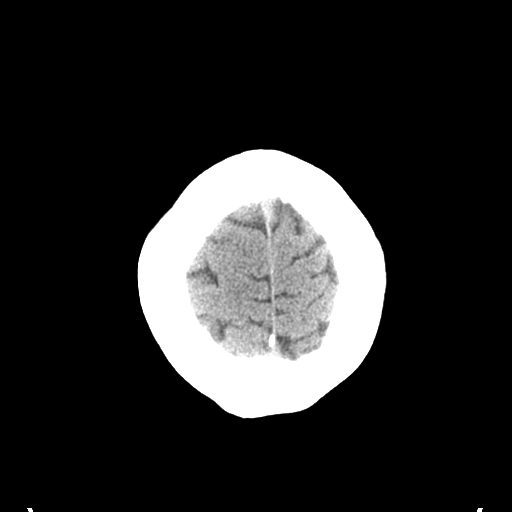
[im 25/31  bone]
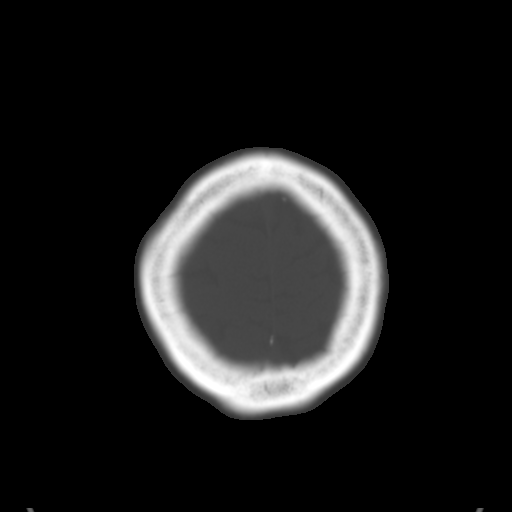
[im 28/31  brain]
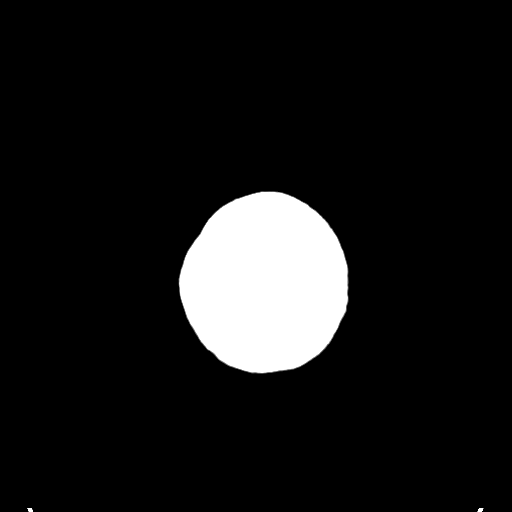

[Series 5: coronal soft tissue · coronal · 0.30mm/px · 3 of 69 slices shown]
[im 23/69  brain]
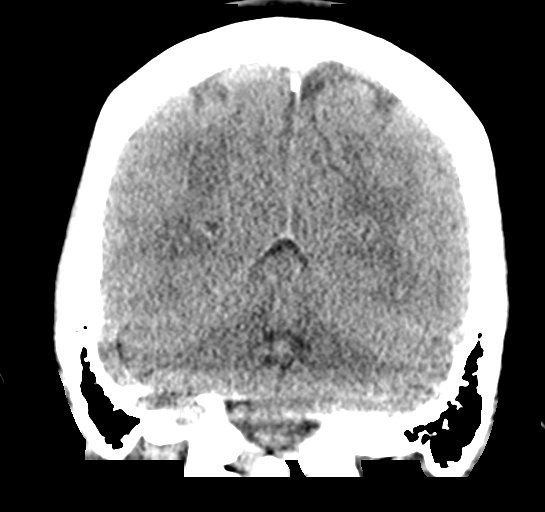
[im 31/69  brain]
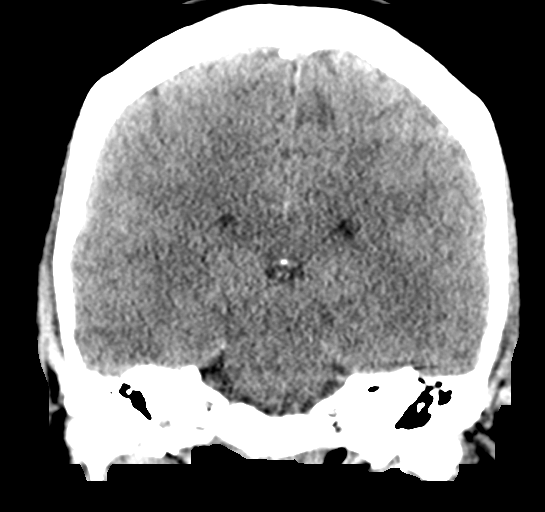
[im 38/69  brain]
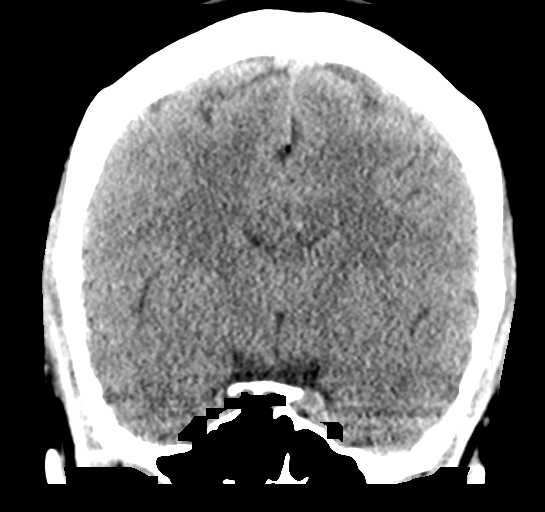

[Series 6: sagittal soft tissue · sagittal · 0.30mm/px · 3 of 56 slices shown]
[im 19/56  brain]
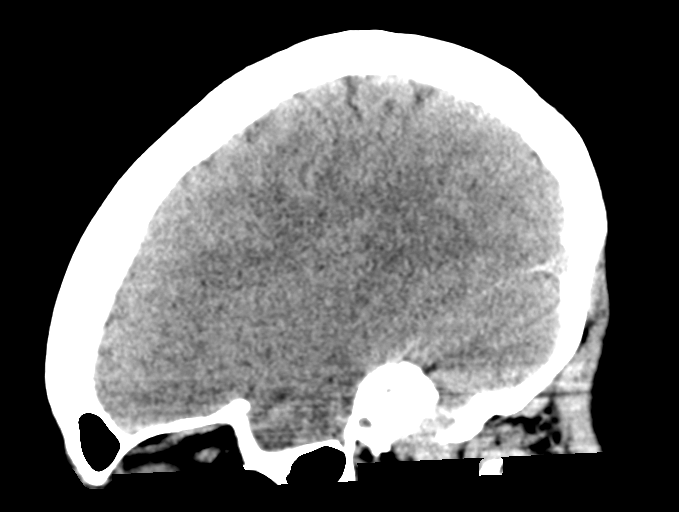
[im 28/56  brain]
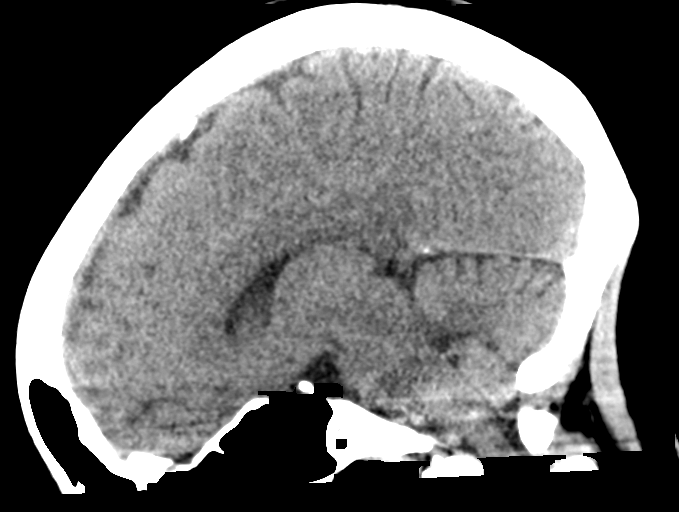
[im 37/56  brain]
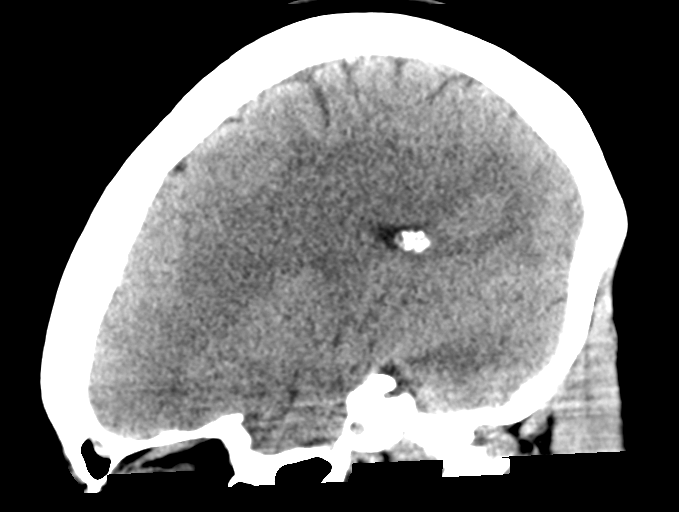

[16 of 47 positions shown; findings below may reference images not displayed]

FINDINGS: Brain: No acute infarct or hemorrhage. Lateral ventricles and
midline structures are unremarkable. No acute extra-axial fluid
collections. No mass effect.

Vascular: No hyperdense vessel or unexpected calcification.

Skull: Normal. Negative for fracture or focal lesion.

Sinuses/Orbits: There is opacification of the right frontal sinus.
Remaining sinuses are clear.

Other: None.
IMPRESSION: 1. Right frontal sinus disease.
2. No acute intracranial process.

## 2022-03-27 ENCOUNTER — Emergency Department (HOSPITAL_COMMUNITY): Payer: 59

## 2022-03-27 ENCOUNTER — Emergency Department (HOSPITAL_COMMUNITY)
Admission: EM | Admit: 2022-03-27 | Discharge: 2022-03-28 | Disposition: A | Discharge status: Home / Self Care | Payer: 59 | Attending: Emergency Medicine | Admitting: Emergency Medicine

## 2022-03-27 ENCOUNTER — Other Ambulatory Visit: Payer: Self-pay

## 2022-03-27 DIAGNOSIS — I2510 Atherosclerotic heart disease of native coronary artery without angina pectoris: Secondary | ICD-10-CM | POA: Diagnosis not present

## 2022-03-27 DIAGNOSIS — J069 Acute upper respiratory infection, unspecified: Secondary | ICD-10-CM | POA: Diagnosis not present

## 2022-03-27 DIAGNOSIS — Z1152 Encounter for screening for COVID-19: Secondary | ICD-10-CM | POA: Diagnosis not present

## 2022-03-27 DIAGNOSIS — Z8521 Personal history of malignant neoplasm of larynx: Secondary | ICD-10-CM | POA: Insufficient documentation

## 2022-03-27 DIAGNOSIS — J029 Acute pharyngitis, unspecified: Secondary | ICD-10-CM | POA: Diagnosis present

## 2022-03-27 LAB — RESP PANEL BY RT-PCR (FLU A&B, COVID) ARPGX2
Influenza A by PCR: NEGATIVE
Influenza B by PCR: NEGATIVE
SARS Coronavirus 2 by RT PCR: NEGATIVE

## 2022-03-27 MED ORDER — HYDROCODONE BIT-HOMATROP MBR 5-1.5 MG/5ML PO SOLN: 5.0000 mL | Freq: Four times a day (QID) | ORAL | 0 refills | Status: AC | PRN

## 2022-03-27 NOTE — ED Triage Notes (Signed)
Body aches, cough since Tuesday getting worse  Body aches.  Fever and chills.

## 2022-03-27 NOTE — ED Provider Triage Note (Signed)
Emergency Medicine Provider Triage Evaluation Note  Mackenzie Oliver , a 51 y.o. female  was evaluated in triage.  Pt complains of body aches, nasal congestion, cough, runny nose times a week.  Recently had a cruise for her birthday, she is been feeling bad since then.  She has been trying elderberry syrup, vitamin C, over-the-counter analgesics with some relief.  Denies any specific chest pain but her chest does hurt when she coughs..  Review of Systems  Per HPI  Physical Exam  BP 116/89 (BP Location: Left Arm)   Pulse 88   Temp 97.9 F (36.6 C) (Oral)   Resp 16   LMP 08/11/2019   SpO2 100%  Gen:   Awake, no distress   Resp:  Normal effort  MSK:   Moves extremities without difficulty  Other:  Lungs clear  Medical Decision Making  Medically screening exam initiated at 6:40 PM.  Appropriate orders placed.  Vern Claude was informed that the remainder of the evaluation will be completed by another provider, this initial triage assessment does not replace that evaluation, and the importance of remaining in the ED until their evaluation is complete.     Sherrill Raring, PA-C 03/27/22 1842

## 2022-03-27 NOTE — ED Provider Notes (Signed)
[facility] Provider Note   CSN: [id number] Arrival date & time: 03/27/22  1757     History  Chief Complaint  Patient presents with   Cough    [name] is a 51 y.o. female.  51 year old female presents today for evaluation of about 1 week duration of URI symptoms including body aches, cough, pharyngitis, sinus congestion, left conjunctivitis.  Conjunctivitis started this morning.  No significant drainage from the eye.  Denies any vision change.  Also has chest pain only with coughing spells.  No resting chest pain.  Denies pleuritic chest pain.  She also was seen for atypical chest pain by her PCP and referred to cardiologist where she underwent cardiac testing including stress test and was told her work-up was reassuring.  She has history of vocal cord cancer, otherwise no significant past medical history.  She states after onset of her symptoms she did take over-the-counter supplements including elderberry and vitamin C.  Denies shortness of breath.  Primary concern for today's visit was to ensure she did not have pneumonia.  The history is provided by the patient. No language interpreter was used.       Home Medications Prior to Admission medications   Medication Sig Start Date End Date Taking? Authorizing Provider  HYDROcodone bit-homatropine (HYCODAN) 5-1.5 MG/5ML syrup Take 5 mLs by mouth every 6 (six) hours as needed for cough. 03/27/22  Yes Marlene Beidler, PA-C  APPLE CIDER VINEGAR PO Take 10 mLs by mouth daily.    [provider]  fluticasone (FLONASE) 50 MCG/ACT nasal spray Place 2 sprays into both nostrils daily. 04/30/17   Dorie Rank, MD  Garlic 10 MG CAPS Take 2 capsules by mouth daily.    [provider]  hydrOXYzine (ATARAX/VISTARIL) 25 MG tablet Take 1 tablet (25 mg total) by mouth every 6 (six) hours as needed. 12/05/20   Tedd Sias, PA  pseudoephedrine (SUDAFED 12 HOUR) 120 MG 12 hr tablet Take 1 tablet  (120 mg total) by mouth every 12 (twelve) hours. 04/30/17   Dorie Rank, MD      Allergies    Patient has no known allergies.    Review of Systems   Review of Systems  Constitutional:  Negative for chills and fever.  HENT:  Positive for congestion, postnasal drip and sore throat. Negative for drooling, trouble swallowing and voice change.   Respiratory:  Positive for cough. Negative for shortness of breath.   Cardiovascular:  Positive for chest pain (only with coughing spells). Negative for palpitations and leg swelling.  Neurological:  Negative for light-headedness.  All other systems reviewed and are negative.   Physical Exam Updated Vital Signs BP 133/86 (BP Location: Left Arm)   Pulse 73   Temp 98.2 F (36.8 C) (Oral)   Resp 16   LMP 08/11/2019   SpO2 100%  Physical Exam Vitals and nursing note reviewed.  Constitutional:      General: She is not in acute distress.    Appearance: Normal appearance. She is not ill-appearing.  HENT:     Head: Normocephalic and atraumatic.     Right Ear: Tympanic membrane, ear canal and external ear normal. There is no impacted cerumen.     Left Ear: Tympanic membrane, ear canal and external ear normal. There is no impacted cerumen.     Nose: Nose normal.     Mouth/Throat:     Mouth: Mucous membranes are moist.     Pharynx: No oropharyngeal  exudate or posterior oropharyngeal erythema.  Eyes:     General: No scleral icterus.    Extraocular Movements: Extraocular movements intact.     Conjunctiva/sclera: Conjunctivae normal.  Cardiovascular:     Rate and Rhythm: Normal rate and regular rhythm.     Pulses: Normal pulses.  Pulmonary:     Effort: Pulmonary effort is normal. No respiratory distress.     Breath sounds: Normal breath sounds. No wheezing or rales.  Abdominal:     General: There is no distension.     Tenderness: There is no abdominal tenderness.  Musculoskeletal:        General: Normal range of motion.     Cervical back:  Normal range of motion.  Skin:    General: Skin is warm and dry.  Neurological:     General: No focal deficit present.     Mental Status: She is alert and oriented to person, place, and time. Mental status is at baseline.     ED Results / Procedures / Treatments   Labs (all labs ordered are listed, but only abnormal results are displayed) Labs Reviewed  RESP PANEL BY RT-PCR (FLU A&B, COVID) ARPGX2    EKG None  Radiology DG Chest 2 View  Result Date: 03/27/2022 CLINICAL DATA:  Cough. Body aches. Fever and chills. EXAM: CHEST - 2 VIEW COMPARISON:  12/05/2020 FINDINGS: The cardiomediastinal contours are normal. The lungs are clear. Pulmonary vasculature is normal. No consolidation, pleural effusion, or pneumothorax. No acute osseous abnormalities are seen. Artifact from patient's hair projects over the upper lung zones on the frontal view. IMPRESSION: No acute chest findings. Electronically Signed   By: Keith Rake M.D.   On: 03/27/2022 19:23    Procedures Procedures    Medications Ordered in ED Medications - No data to display  ED Course/ Medical Decision Making/ A&P                           Medical Decision Making Risk Prescription drug management.   Medical Decision Making / ED Course   This patient presents to the ED for concern of chest pain with coughing spells, URI symptoms, this involves an extensive number of treatment options, and is a complaint that carries with it a high risk of complications and morbidity.  The differential diagnosis includes ACS, PE, pneumonia, viral URI with cough, sinusitis  MDM: 51 year old female presents with above-mentioned symptoms.  Does not have history of CAD.  Chest x-ray without acute cardiopulmonary process.  COVID and flu test is negative.  She is overall well-appearing.  Does have left eye conjunctivitis without significant drainage.  Symptoms appear consistent with viral URI.  We discussed symptomatic management including  Hycodan for significant coughing spells.  We also discussed additional work-up given her chest pain with troponin, EKG as well as other basic blood work.  Given her recent cardiac testing, atypical story she would like to defer this and follow-up with her PCP for reevaluation.  Any worsening symptoms she will return to the emergency room for evaluation.  Patient is appropriate for discharge.  Discharged in stable condition.  Return precautions discussed.   Additional history obtained: -Additional history obtained from PCP visit from September 2023 -External records from outside source obtained and reviewed including: Chart review including previous notes, labs, imaging, consultation notes   Lab Tests: -I ordered, reviewed, and interpreted labs.   The pertinent results include:   Labs Reviewed  RESP PANEL BY  RT-PCR (FLU A&B, COVID) ARPGX2      EKG  EKG Interpretation  Date/Time:    Ventricular Rate:    PR Interval:    QRS Duration:   QT Interval:    QTC Calculation:   R Axis:     Text Interpretation:           Imaging Studies ordered: I ordered imaging studies including chest x-ray I independently visualized and interpreted imaging. I agree with the radiologist interpretation   Medicines ordered and prescription drug management: Meds ordered this encounter  Medications   HYDROcodone bit-homatropine (HYCODAN) 5-1.5 MG/5ML syrup    Sig: Take 5 mLs by mouth every 6 (six) hours as needed for cough.    Dispense:  120 mL    Refill:  0    Order Specific Question:   Supervising Provider    Answer:   MILLER, BRIAN [[id number]]    -I have reviewed the patients home medicines and have made adjustments as needed  Reevaluation: After the interventions noted above, I reevaluated the patient and found that they have :stayed the same  Co morbidities that complicate the patient evaluation  Past Medical History:  Diagnosis Date   Cancer ([place])    vocal cord   Cancer ([place])        Dispostion: Patient is appropriate for discharge.  Discharged in stable condition.  Return precautions discussed.  Patient voices understanding and is in agreement with plan.  Additional blood work, EKG was deferred today following a shared decision making conversation.   Final Clinical Impression(s) / ED Diagnoses Final diagnoses:  Viral URI with cough    Rx / DC Orders ED Discharge Orders          Ordered    HYDROcodone bit-homatropine (HYCODAN) 5-1.5 MG/5ML syrup  Every 6 hours PRN        03/27/22 2348              Evlyn Courier, PA-C 88/89/16 [id number]    Delora Fuel, MD 38/88/28 2248

## 2022-03-27 NOTE — Discharge Instructions (Addendum)
Your work-up today is overall reassuring.  COVID and flu test are negative.  Chest x-ray does not show any evidence of pneumonia.  Lung sounds are clear.  Likely have a viral upper respiratory infection.  We discussed continuing Mucinex, warm compress for your left eye, adequate fluid intake.  I have sent in cough medicine that has a narcotic component to it to help suppress your cough.  Please only use this for severe coughing spells or at bedtime.  Do not drive or operate heavy machinery after taking this.  For any concerning symptoms such as worsening chest pain, shortness of breath return to the emergency room.  You state your chest pain only occurs with coughing spells.  We did discuss additional testing however we discussed that your low risk given your story and recent cardiac testing for cardiac related chest pain.  You deferred to have additional testing done at this time.  You will return for any worsening symptoms, otherwise you will follow-up with your primary care provider.

## 2022-07-12 ENCOUNTER — Other Ambulatory Visit: Payer: Self-pay | Admitting: Obstetrics and Gynecology

## 2022-07-12 DIAGNOSIS — N6310 Unspecified lump in the right breast, unspecified quadrant: Secondary | ICD-10-CM

## 2022-07-24 ENCOUNTER — Inpatient Hospital Stay: Admission: RE | Admit: 2022-07-24 | Payer: 59 | Source: Ambulatory Visit
